# Patient Record
Sex: Male | Born: 1980 | Race: Black or African American | Hispanic: No | Marital: Married
Health system: Southern US, Community
[De-identification: ages and names within clinical notes are randomized; demographics above are authoritative.]

---
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
## 2001-02-11 ENCOUNTER — Emergency Department (HOSPITAL_COMMUNITY): Admission: EM | Admit: 2001-02-11 | Discharge: 2001-02-11 | Payer: Self-pay

## 2001-05-04 ENCOUNTER — Encounter: Payer: Self-pay | Admitting: Emergency Medicine

## 2001-05-04 ENCOUNTER — Emergency Department (HOSPITAL_COMMUNITY): Admission: AC | Admit: 2001-05-04 | Discharge: 2001-05-04 | Payer: Self-pay

## 2002-08-08 ENCOUNTER — Encounter: Payer: Self-pay | Admitting: Emergency Medicine

## 2002-08-08 ENCOUNTER — Emergency Department (HOSPITAL_COMMUNITY): Admission: EM | Admit: 2002-08-08 | Discharge: 2002-08-08 | Payer: Self-pay | Admitting: Emergency Medicine

## 2004-02-29 ENCOUNTER — Emergency Department (HOSPITAL_COMMUNITY): Admission: EM | Admit: 2004-02-29 | Discharge: 2004-02-29 | Payer: Self-pay | Admitting: Emergency Medicine

## 2006-04-18 ENCOUNTER — Emergency Department (HOSPITAL_COMMUNITY): Admission: EM | Admit: 2006-04-18 | Discharge: 2006-04-19 | Payer: Self-pay | Admitting: Emergency Medicine

## 2007-10-12 ENCOUNTER — Emergency Department (HOSPITAL_COMMUNITY): Admission: EM | Admit: 2007-10-12 | Discharge: 2007-10-12 | Payer: Self-pay | Admitting: Emergency Medicine

## 2009-07-22 ENCOUNTER — Emergency Department (HOSPITAL_COMMUNITY): Admission: EM | Admit: 2009-07-22 | Discharge: 2009-07-22 | Payer: Self-pay | Admitting: Emergency Medicine

## 2011-04-26 LAB — DIFFERENTIAL
Basophils Absolute: 0
Basophils Relative: 0
Eosinophils Absolute: 0
Eosinophils Relative: 0
Lymphocytes Relative: 18
Lymphs Abs: 1.1
Monocytes Absolute: 0.2
Monocytes Relative: 3
Neutro Abs: 4.6
Neutrophils Relative %: 79 — ABNORMAL HIGH

## 2011-04-26 LAB — CBC
HCT: 43.3
Hemoglobin: 14.5
MCHC: 33.6
MCV: 89.2
Platelets: 197
RBC: 4.85
RDW: 13
WBC: 5.9

## 2011-04-26 LAB — COMPREHENSIVE METABOLIC PANEL
ALT: 45
AST: 108 — ABNORMAL HIGH
Albumin: 3.9
Alkaline Phosphatase: 55
BUN: 14
CO2: 26
Calcium: 9.2
Chloride: 108
Creatinine, Ser: 1.06
GFR calc Af Amer: >60
GFR calc non Af Amer: >60
Glucose, Bld: 78
Potassium: 4.4
Sodium: 138
Total Bilirubin: 1
Total Protein: 7

## 2021-12-29 ENCOUNTER — Emergency Department (HOSPITAL_COMMUNITY): Payer: Medicaid Other

## 2021-12-29 ENCOUNTER — Other Ambulatory Visit: Payer: Self-pay

## 2021-12-29 ENCOUNTER — Emergency Department (HOSPITAL_COMMUNITY)
Admission: EM | Admit: 2021-12-29 | Discharge: 2021-12-29 | Disposition: A | Discharge status: Home / Self Care | Payer: Medicaid Other | Attending: Emergency Medicine | Admitting: Emergency Medicine

## 2021-12-29 ENCOUNTER — Encounter (HOSPITAL_COMMUNITY): Payer: Self-pay

## 2021-12-29 DIAGNOSIS — R079 Chest pain, unspecified: Secondary | ICD-10-CM | POA: Insufficient documentation

## 2021-12-29 DIAGNOSIS — Y92410 Unspecified street and highway as the place of occurrence of the external cause: Secondary | ICD-10-CM | POA: Insufficient documentation

## 2021-12-29 DIAGNOSIS — M25511 Pain in right shoulder: Secondary | ICD-10-CM | POA: Diagnosis present

## 2021-12-29 LAB — I-STAT CREATININE, ED: Creatinine, Ser: 1.5 mg/dL — ABNORMAL HIGH (ref 0.61–1.24)

## 2021-12-29 MED ORDER — METHOCARBAMOL 500 MG PO TABS: 500.0000 mg | ORAL_TABLET | Freq: Two times a day (BID) | ORAL | 0 refills | Status: AC

## 2021-12-29 MED ORDER — HYDROCODONE-ACETAMINOPHEN 5-325 MG PO TABS
1.0000 | ORAL_TABLET | Freq: Once | ORAL | Status: AC
  Administered 2021-12-29: 1 via ORAL
  Filled 2021-12-29: qty 1

## 2021-12-29 MED ORDER — IOHEXOL 300 MG/ML  SOLN
100.0000 mL | Freq: Once | INTRAMUSCULAR | Status: AC | PRN
  Administered 2021-12-29: 100 mL via INTRAVENOUS

## 2021-12-29 MED ORDER — MELOXICAM 15 MG PO TABS: 15.0000 mg | ORAL_TABLET | Freq: Every day | ORAL | 2 refills | Status: AC
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
## 2024-06-06 ENCOUNTER — Emergency Department (HOSPITAL_COMMUNITY)
Admission: EM | Admit: 2024-06-06 | Discharge: 2024-06-07 | Disposition: A | Discharge status: Home / Self Care | Attending: Emergency Medicine | Admitting: Emergency Medicine

## 2024-06-06 DIAGNOSIS — G8929 Other chronic pain: Secondary | ICD-10-CM | POA: Insufficient documentation

## 2024-06-06 DIAGNOSIS — M25551 Pain in right hip: Secondary | ICD-10-CM | POA: Diagnosis present

## 2024-06-07 ENCOUNTER — Encounter (HOSPITAL_COMMUNITY): Payer: Self-pay

## 2024-06-07 MED ORDER — KETOROLAC TROMETHAMINE 15 MG/ML IJ SOLN
30.0000 mg | Freq: Once | INTRAMUSCULAR | Status: AC
  Administered 2024-06-07: 30 mg via INTRAMUSCULAR
  Filled 2024-06-07: qty 2

## 2024-06-07 MED ORDER — OXYCODONE HCL 5 MG PO TABS: 5.0000 mg | ORAL_TABLET | Freq: Four times a day (QID) | ORAL | 0 refills | Status: AC | PRN

## 2024-06-07 MED ORDER — CYCLOBENZAPRINE HCL 10 MG PO TABS: 10.0000 mg | ORAL_TABLET | Freq: Two times a day (BID) | ORAL | 0 refills | Status: AC | PRN

## 2024-06-07 NOTE — ED Provider Notes (Signed)
 [facility] EMERGENCY DEPARTMENT AT [facility] Provider Note   CSN: [id number] Arrival date & time: 06/06/24  2322     History Chief Complaint  Patient presents with   Hip Pain    HPI Mark Norman is a 43 y.o. male presenting for chief complaint of acute on chronic right hip pain.  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 Has been having pain shooting down his right leg.  Denies fevers chills nausea vomiting shortness of breath.  Acute on chronic.  Tylenol  without any improvement.  Has not seen spine provider for this in the past..   Patient's recorded medical, surgical, social, medication list and allergies were reviewed in the Snapshot window as part of the initial history.   Review of Systems   Review of Systems  Constitutional:  Negative for chills and fever.  HENT:  Negative for ear pain and sore throat.   Eyes:  Negative for pain and visual disturbance.  Respiratory:  Negative for cough and shortness of breath.   Cardiovascular:  Negative for chest pain and palpitations.  Gastrointestinal:  Negative for abdominal pain and vomiting.  Genitourinary:  Negative for dysuria and hematuria.  Musculoskeletal:  Positive for back pain. Negative for arthralgias and gait problem.  Skin:  Negative for color change and rash.  Neurological:  Negative for seizures and syncope.  All other systems reviewed and are negative.   Physical Exam Updated Vital Signs BP (!) 167/91   Pulse 69   Temp 98.4 F (36.9 C) (Oral)   Resp 17   Ht 5' 9 (1.753 m)   Wt 70.3 kg   SpO2 97%   BMI 22.89 kg/m  Physical Exam Vitals and nursing note reviewed.  Constitutional:      General: He is not in acute distress.    Appearance: He is well-developed.  HENT:     Head: Normocephalic and atraumatic.  Eyes:     Conjunctiva/sclera: Conjunctivae normal.  Cardiovascular:     Rate and Rhythm: Normal rate and regular rhythm.      Heart sounds: No murmur heard. Pulmonary:     Effort: Pulmonary effort is normal. No respiratory distress.     Breath sounds: Normal breath sounds.  Abdominal:     Palpations: Abdomen is soft.     Tenderness: There is no abdominal tenderness.  Musculoskeletal:        General: No swelling.     Cervical back: Neck supple.  Skin:    General: Skin is warm and dry.     Capillary Refill: Capillary refill takes less than 2 seconds.  Neurological:     General: No focal deficit present.     Mental Status: He is alert and oriented to person, place, and time. Mental status is at baseline.     Cranial Nerves: No cranial nerve deficit.     Sensory: No sensory deficit.  Psychiatric:        Mood and Affect: Mood normal.      ED Course/ Medical Decision Making/ A&P    Procedures Procedures   Medications Ordered in ED Medications  ketorolac (TORADOL) 15 MG/ML injection 30 mg (has no administration in time range)   Medical Decision Making:   Mark Norman is a 43 y.o. male who presented to the ED today with acute lower back pain over the past 12 hours, detailed above.    Patient placed on continuous vitals and telemetry monitoring while in  ED which was reviewed periodically.   On my initial exam, the pt was with an intact neurologic exam, tolerating ambulation with an antalgic gait and p.o. intake without difficulty.  Patient had no abnormal DTRs, no midline spinal tenderness.  Patient endorsing complete sensation of the perineum.  Patient without episodes of fecal or urinary incontinence.  Patient has no focal neurologic deficits and reassuring vital signs at this time.  No obvious physical abnormality or injury on exam. Notably, patient denies recent trauma, is afebrile, and denies IVDU.   Reviewed and confirmed nursing documentation for past medical history, family history, social history.    Initial Assessment:   With the patient's presentation of acute back pain in the above setting,  most likely diagnosis is musculoskeletal strain. Other diagnoses were considered including (but not limited to) underlying fracture, epidural hematoma, cauda equina syndrome, spinal stenosis, spinal malignancy. These are considered less likely due to history of present illness and physical exam findings.   In particular, lack of fever, substantial history of IV drug use, or substantial neurologic abnormality is less consistent with epidural abscess versus discitis or other spinal infection. In particular,  Initial Plan:  Multimodal pain control described and patient informed on safe usage.  Patient stable for continued outpatient evaluation and management of their musculoskeletal pains.  Patient referred back to primary care provider for continued evaluation and management.   Disposition:   Based on the above findings, I believe patient is stable for discharge.    Patient and family educated about specific return precautions for given chief complaint and symptoms.  Patient and family educated about follow-up with PCP.  Patient and family expressed understanding of return precautions and need for follow-up. Patient spoken to regarding all imaging and laboratory results and appropriate follow up for these results. All education provided in verbal and written form and time was allowed for answering of patient questions. Patient discharged.          Emergency Department Medication Summary:   Medications  ketorolac (TORADOL) 15 MG/ML injection 30 mg (has no administration in time range)     Clinical Impression:  1. Right hip pain      Discharge   Final Clinical Impression(s) / ED Diagnoses Final diagnoses:  Right hip pain    Rx / DC Orders ED Discharge Orders          Ordered    oxyCODONE (ROXICODONE) 5 MG immediate release tablet  Every 6 hours PRN        06/07/24 0317    cyclobenzaprine (FLEXERIL) 10 MG tablet  2 times daily PRN        06/07/24 0317               Jerral Meth, MD 06/07/24 [phone]
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
